# Patient Record
Sex: Male | Born: 1980 | Race: White | Hispanic: No | Marital: Single | Smoking: Current every day smoker
Health system: Southern US, Community
[De-identification: ages and names within clinical notes are randomized; demographics above are authoritative.]

## PROBLEM LIST (undated history)

## (undated) DIAGNOSIS — F1910 Other psychoactive substance abuse, uncomplicated: Secondary | ICD-10-CM

---

## 2001-06-29 ENCOUNTER — Emergency Department (HOSPITAL_COMMUNITY): Admission: EM | Admit: 2001-06-29 | Discharge: 2001-06-29 | Payer: Self-pay | Admitting: Emergency Medicine

## 2003-12-05 ENCOUNTER — Emergency Department (HOSPITAL_COMMUNITY): Admission: EM | Admit: 2003-12-05 | Discharge: 2003-12-06 | Payer: Self-pay | Admitting: Emergency Medicine

## 2004-05-11 ENCOUNTER — Emergency Department (HOSPITAL_COMMUNITY): Admission: EM | Admit: 2004-05-11 | Discharge: 2004-05-11 | Payer: Self-pay | Admitting: *Deleted

## 2007-05-21 ENCOUNTER — Encounter: Payer: Self-pay | Admitting: Emergency Medicine

## 2007-05-21 ENCOUNTER — Ambulatory Visit: Payer: Self-pay | Admitting: Psychiatry

## 2007-05-21 ENCOUNTER — Inpatient Hospital Stay (HOSPITAL_COMMUNITY): Admission: RE | Admit: 2007-05-21 | Discharge: 2007-06-01 | Payer: Self-pay | Admitting: Psychiatry

## 2008-03-01 ENCOUNTER — Emergency Department: Payer: Self-pay | Admitting: Emergency Medicine

## 2008-05-12 ENCOUNTER — Encounter: Admission: RE | Admit: 2008-05-12 | Discharge: 2008-05-12 | Payer: Self-pay | Admitting: Orthopedic Surgery

## 2008-07-17 IMAGING — CR DG CHEST 2V
2 series · 2 of 2 positions shown · non-contrast
Comparison: None.

CLINICAL DATA: Voluntary commitment for detoxification. Smoker with cough.

CHEST - 2 VIEW  05/21/2007:

[view not recorded (1 of 2)]
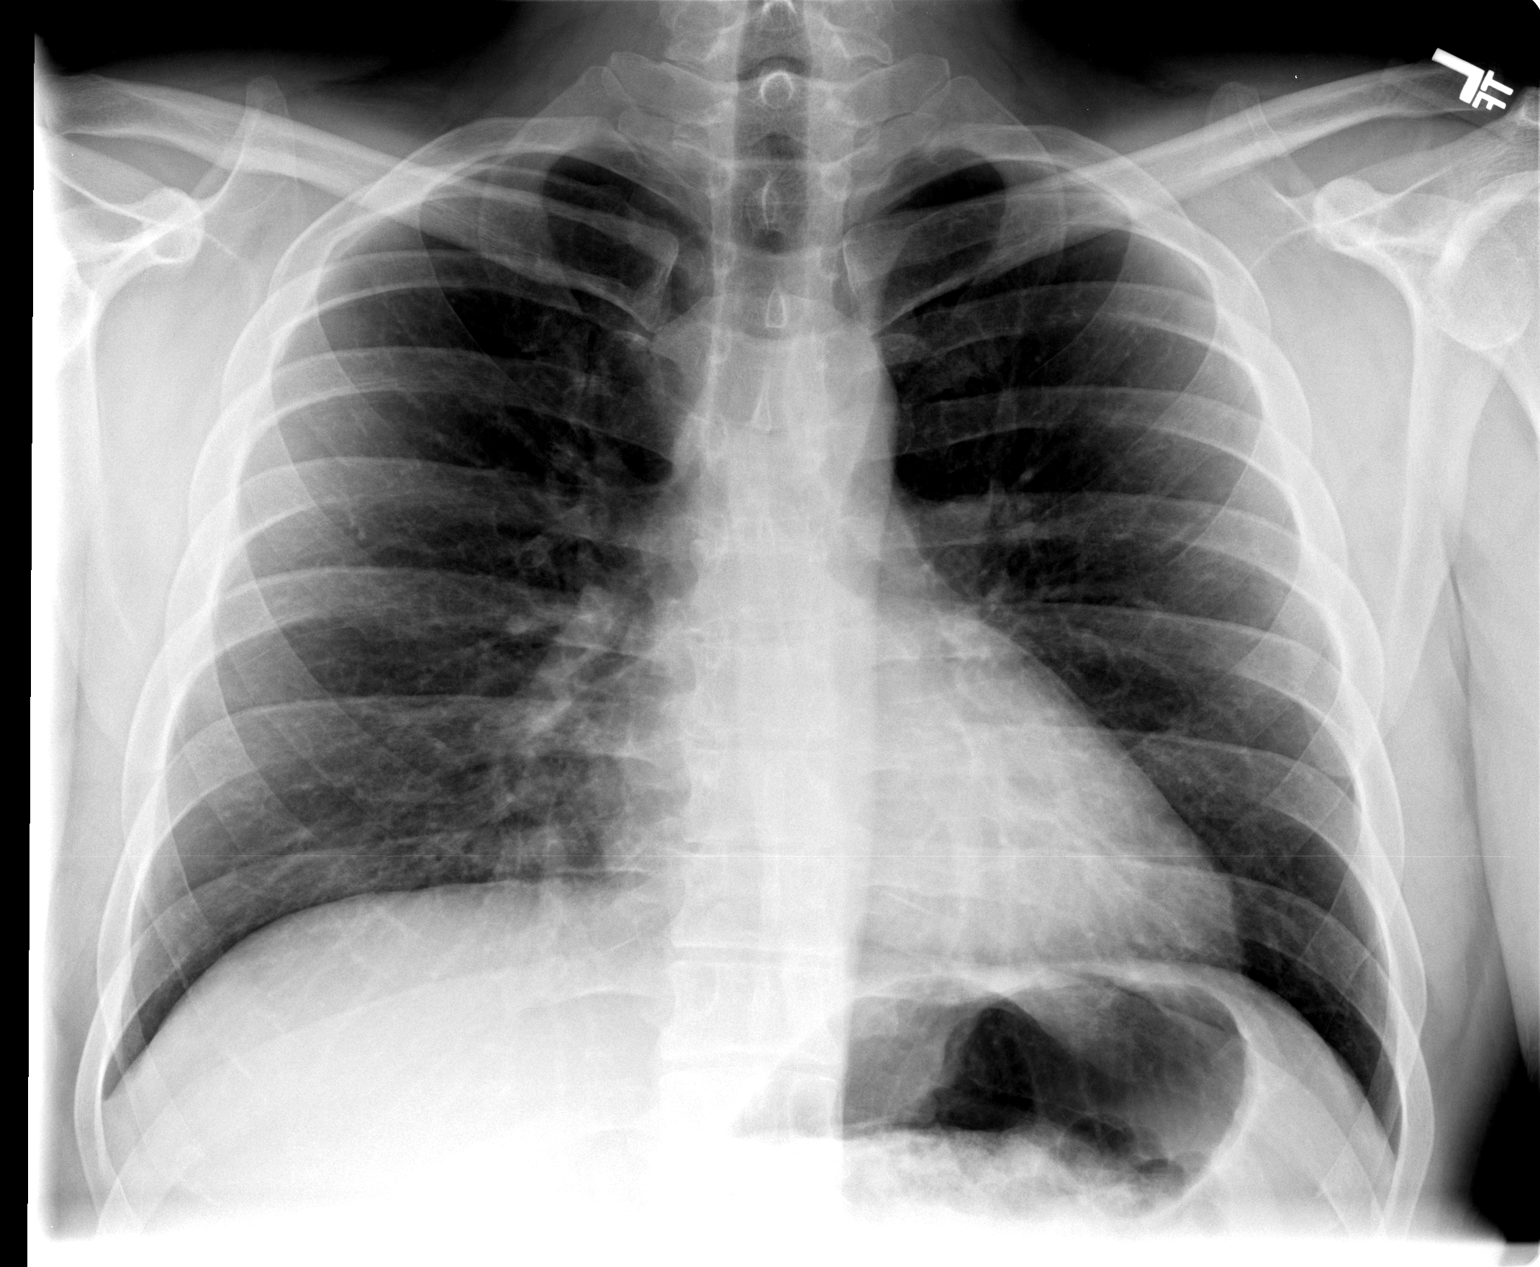

[view not recorded (2 of 2)]
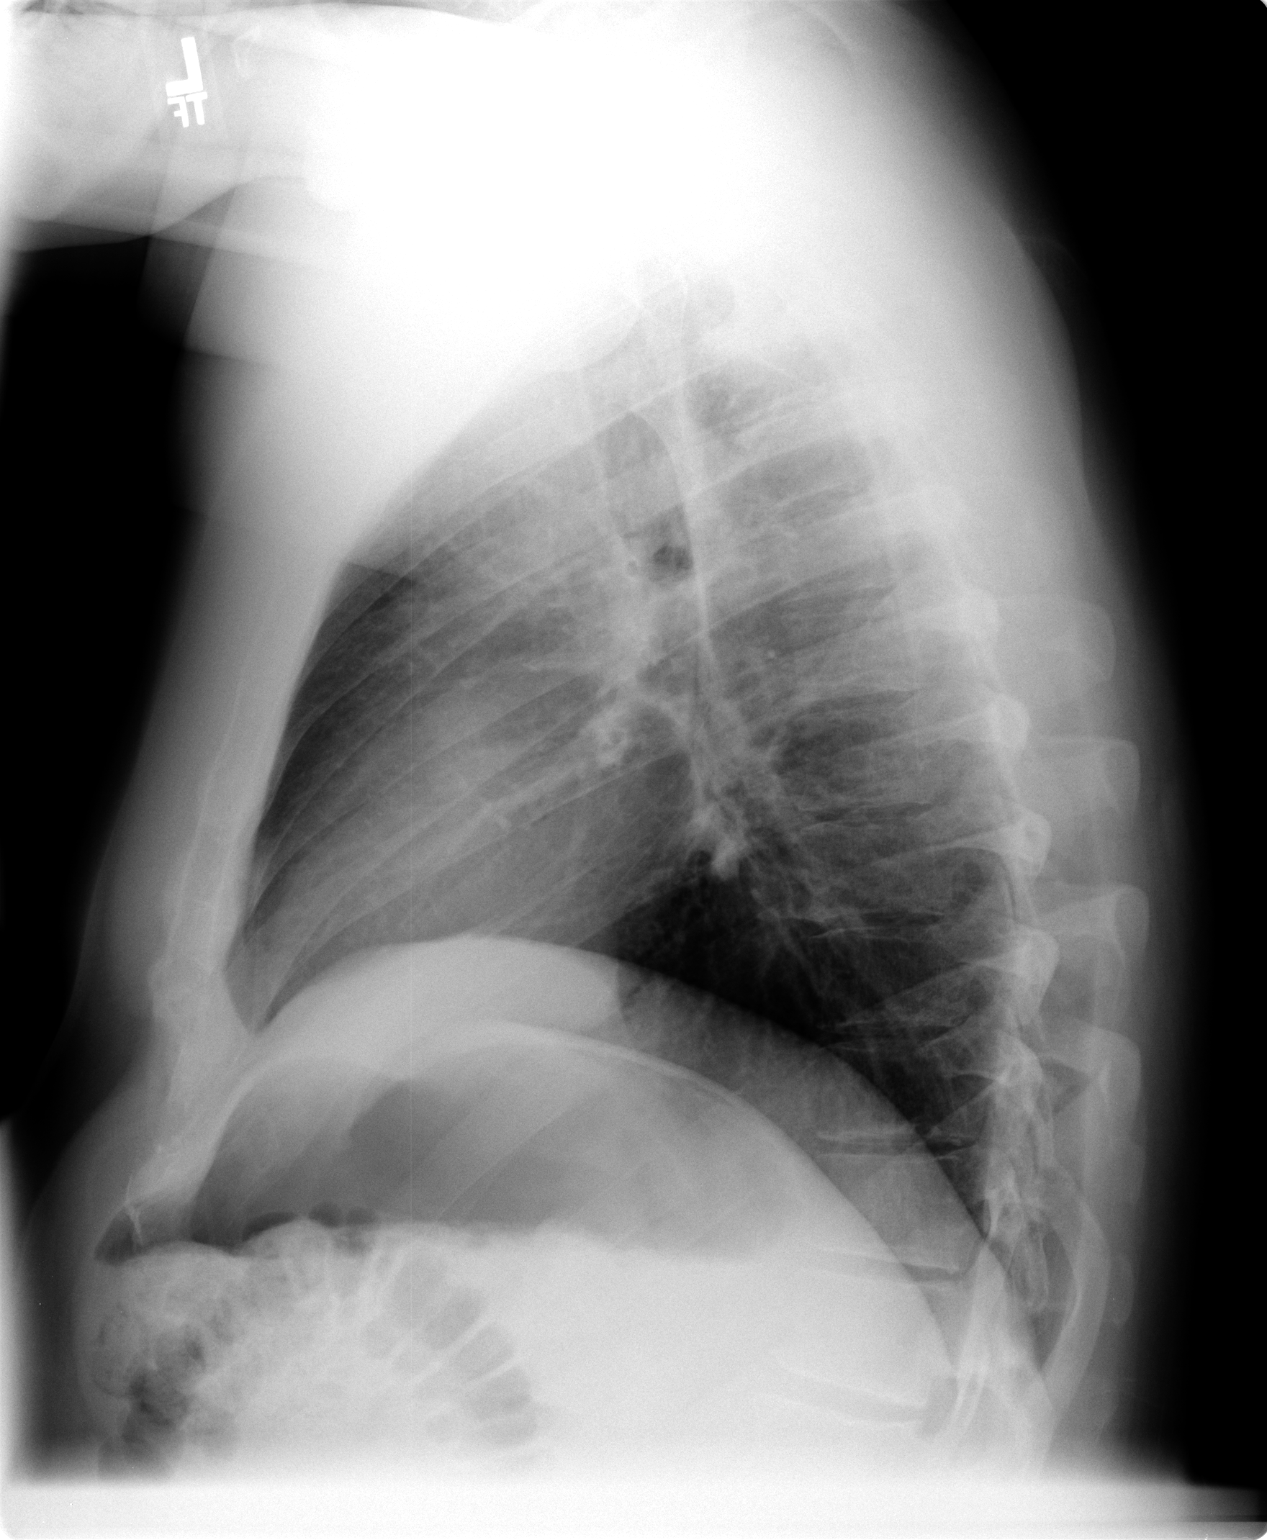

[2 of 2 positions shown; findings below may reference images not displayed]

FINDINGS: Cardiomediastinal silhouette unremarkable. Mildly prominent
bronchovascular markings centrally and mild central peribronchial thickening. No
localized airspace consolidation. No pleural effusions. Visualized bony thorax
intact.
IMPRESSION: Mild changes of asthma and/or bronchitis, either acute or chronic, without
localized consolidation.

## 2008-10-18 ENCOUNTER — Emergency Department (HOSPITAL_COMMUNITY): Admission: EM | Admit: 2008-10-18 | Discharge: 2008-10-18 | Payer: Self-pay | Admitting: Emergency Medicine

## 2009-07-09 IMAGING — CT CT EXTREM LOW W/O CM*R*
2 of 4 series · 7 of 14 positions shown, 8 images · non-contrast
Comparison: None

CLINICAL DATA: Fibular fracture July 2007 post ORIF.  New
injury 1 week ago.  Evaluate for nonunion.

CT OF THE RIGHT ANKLE WITHOUT CONTRAST
TECHNIQUE: Multidetector CT imaging of the right ankle was
performed according to the standard protocol without intravenous
contrast. Multiplanar CT image reconstructions were also generated.

[Series 2: knee/bone · axial · 0.31mm/px · z∈[-129,+4]mm · 4 of 178 slices shown, 5 images]
[im 36/178  soft-tissue]
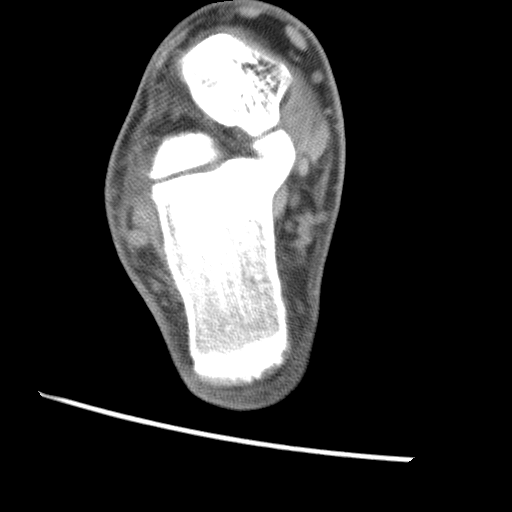
[im 36/178  bone]
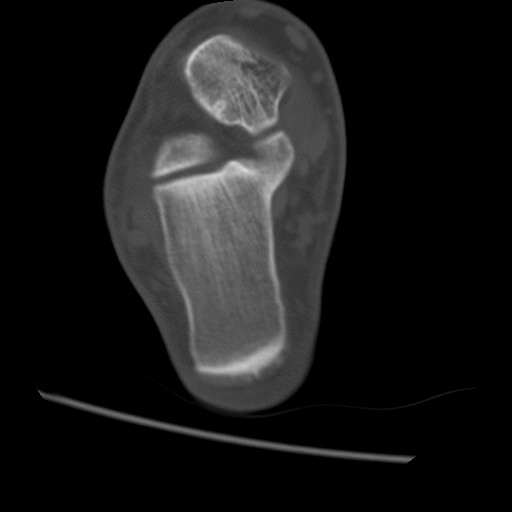
[im 71/178  bone]
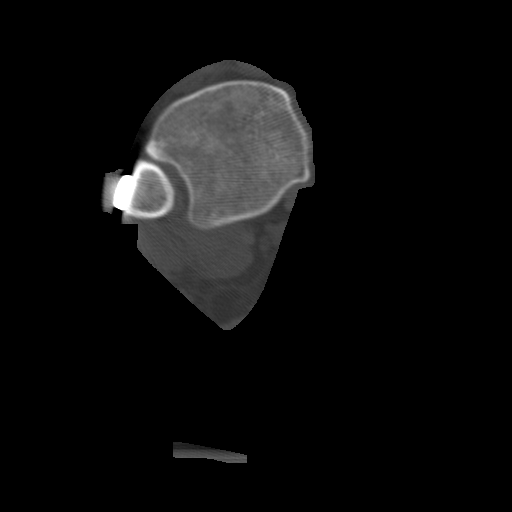
[im 107/178  bone]
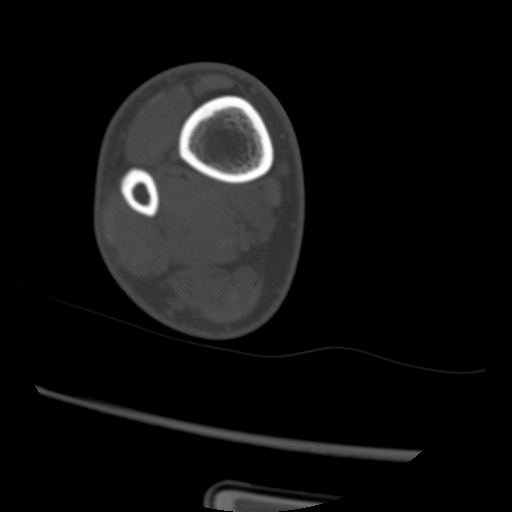
[im 142/178  bone]
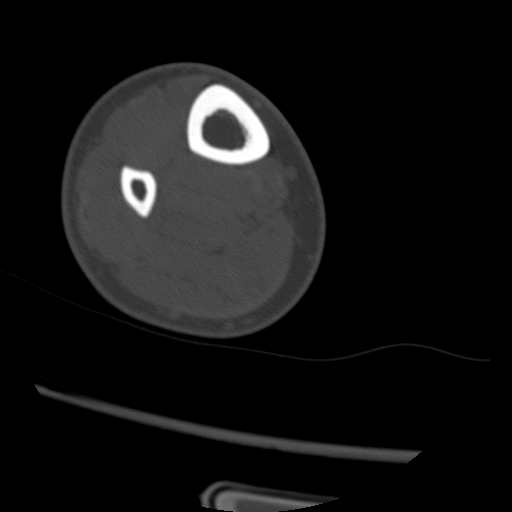

[Series 3: knee/detail · axial · 0.31mm/px · z∈[-118,-6]mm · 3 of 177 slices shown]
[im 45/177  bone]
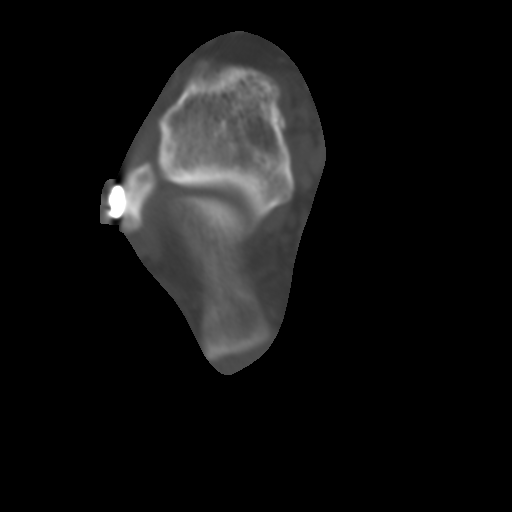
[im 89/177  bone]
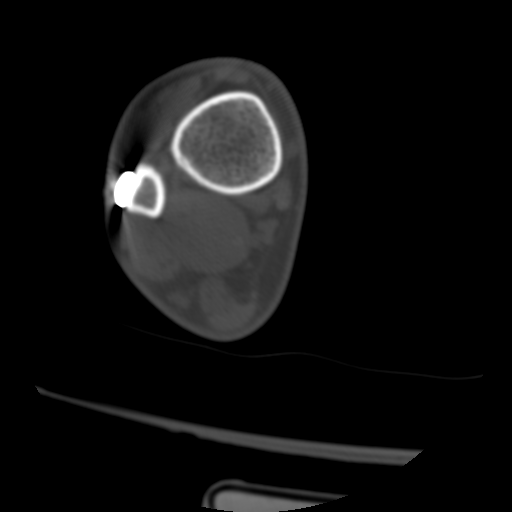
[im 133/177  bone]
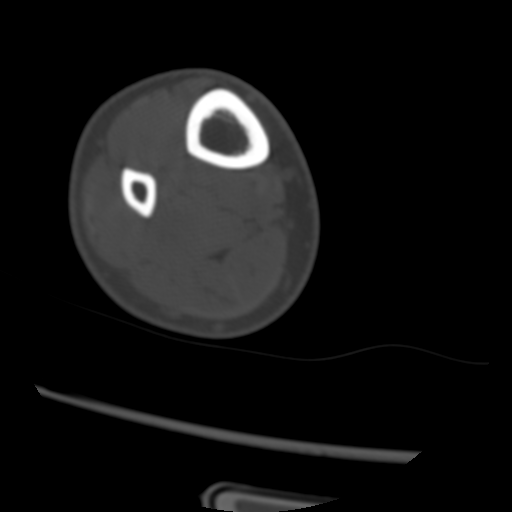

[7 of 14 positions shown; findings below may reference images not displayed]

FINDINGS: The patient is status post lateral screw and plate
fixation of the distal fibula.  The hardware is intact.  There is
no evidence of hardware loosening.  The fibular fracture appears
well healed.  There is no evidence of nonunion.  No acute fracture
or dislocation is seen.

There are small cysts anteriorly in the distal fibula adjacent to
the articulation with the talus.  There is no widening of the
distal tibiofibular joint.  However, there are small ossific
fragments both anteriorly and posteriorly in the joint, some of
which are likely intra-articular loose bodies.  The tibial plafond
and talar dome appear normal.  The medial malleolus appears normal.

The visualized tarsal bones appear normal.  No significant ankle
joint effusion is demonstrated.  There is no obvious tendon
abnormality at the ankle.
IMPRESSION: 1.  No evidence of distal fibular nonunion.  The hardware appears
intact without loosening.
2.  Post-traumatic subchondral cystic changes anteriorly in the
distal fibula adjacent to the talus.
3.  Periarticular ossifications around the distal tibiofibular
joint with probable intra-articular loose bodies.
4.  No evidence of acute fracture or dislocation.

## 2011-02-04 ENCOUNTER — Emergency Department (HOSPITAL_COMMUNITY): Payer: BC Managed Care – PPO

## 2011-02-04 ENCOUNTER — Emergency Department (HOSPITAL_COMMUNITY)
Admission: EM | Admit: 2011-02-04 | Discharge: 2011-02-04 | Disposition: A | Discharge status: Home / Self Care | Payer: BC Managed Care – PPO | Attending: Emergency Medicine | Admitting: Emergency Medicine

## 2011-02-04 DIAGNOSIS — R079 Chest pain, unspecified: Secondary | ICD-10-CM | POA: Insufficient documentation

## 2011-04-02 NOTE — H&P (Signed)
Ralph Shelton, AMAN NO.:  [id number]   MEDICAL RECORD NO.:  [id number]          PATIENT TYPE:  IPS   LOCATION:  0603                          FACILITY:  [facility]   PHYSICIAN:  Geoffery Lyons, M.D.      DATE OF BIRTH:  July 26, 1981   DATE OF ADMISSION:  05/21/2007  DATE OF DISCHARGE:                              HISTORY & PHYSICAL   This is a 30 year old male that was voluntarily admitted on 05/21/2007.   HISTORY OF PRESENT ILLNESS:  The patient presents with a history of  opiate abuse, has been using Percocet, OxyContin.  He had run out of his  methadone.  He has been on methadone for the past two years and wants to  be off his medications. He states it is going to kill me.  He also has  been using Xanax at night to help him sleep.  He denies any depression  or suicidal thoughts.  He has a history of substance abuse for the past  10 years.  His longest history of sobriety has been six months.  His  last use was on Tuesday prior to this admission.  The patient states  that he would ideally like to be slowly tapered off his methadone and  would like to go to a long-term treatment program.   PAST PSYCHIATRIC HISTORY:  The patient has been detoxed prior at [facility].  He is currently on methadone 80 mg daily.  In the past he has  been on Wellbutrin and Zoloft.   SOCIAL HISTORY:  This is a 30 year old male who is currently on  disability for his bipolar disorder.  He has two children.   FAMILY HISTORY:  His brother is currently at [facility] for an  overdose of methadone.   ALCOHOL/DRUG HISTORY:  As described above.  Denies any IV drug use.   PRIMARY CARE PHYSICIAN:  None.   MEDICAL PROBLEMS:  Currently has a history of bronchitis and is on  Zithromax.   MEDICATIONS:  Methadone 20 mg q.i.d.   ALLERGIES:  PENICILLIN AND PROZAC.   PHYSICAL EXAMINATION:  The patient was fully assessed at [facility].  Temperature is 100.4, pulse 127, respirations 18,  blood  pressure 138/88, 6' tall, 218 pounds.  He is a healthy appearing male.  Has a productive cough, but in no acute respiratory distress.   MENTAL STATUS EXAMINATION:  He is fully alert, cooperative.  Good eye  contact.  Nicely dressed.  Speech is clear, normal tone.  The patient's  mood, is having withdrawal symptoms, he is hurting.  Affect is  constricted.  Thought processes are coherent.  There is no evidence of  any thought disorder.  Cognitive function intact.  His judgment is good,  insight is fair.  Poor impulse control.  Concentration intact.  Good  fund of worldly knowledge.   LABORATORY DATA:  Urine drug screen is positive for opiates, positive  for benzo's.  Alcohol level less than 5.   WBC count is 14.6, BMET is within normal limits.   IMPRESSION:  AXIS I:  Opiate dependence.  Benzodiazepine abuse.  Bipolar  disorder, not otherwise specified.  AXIS II:  Deferred.  AXIS III:  Fibromyalgia.  Bronchitis.  AXIS IV:  Psychosocial problems related to chronic substance abuse.  AXIS V:  Current is 45.   PLAN:  Contract for safety.  Stabilize __________.  We will initially  put the patient on the Clonidine protocol, work on relapse prevention.  Will have Seroquel p.r.n.  The patient has been on that medication in  the past and has received some benefit.  Case Manager is to investigate  any rehab program available.  We will continue Z-pak for his bronchitis  symptoms.  Encourage fluids.   ESTIMATED LENGTH OF STAY:  Three to four days.      Landry Corporal, N.P.      Geoffery Lyons, M.D.  Electronically Signed    JO/MEDQ  D:  05/25/2007  T:  05/25/2007  Job:  [id number]

## 2011-04-05 NOTE — Discharge Summary (Signed)
NAMETLALOC, TADDEI NO.:  [id number]   MEDICAL RECORD NO.:  [id number]          PATIENT TYPE:  IPS   LOCATION:  0603                          FACILITY:  [facility]   PHYSICIAN:  Anselm Jungling, MD  DATE OF BIRTH:  11/22/1980   DATE OF ADMISSION:  05/21/2007  DATE OF DISCHARGE:  06/01/2007                               DISCHARGE SUMMARY   IDENTIFYING DATA/REASON FOR ADMISSION:  The patient is a 30 year old  male that presented with a history of opiate abuse.  He had been abusing  Percocet and OxyContin.  Also, he reported that he had run out of his  methadone, which he had been on for the previous two years.  He also  been using Xanax at night to help him sleep.  He denied depression or  suicidal thoughts.  He admitted to a 10-year history of substance abuse,  his longest period of sobriety having been six months.  His last use was  several days prior to admission.  He indicated that his wish was to be  slowly tapered off methadone, and to go to a long-term chemical  dependency treatment program.  Please refer to the admission note for  further details pertaining to the symptoms, circumstances and history  that led to his hospitalization.   INITIAL DIAGNOSTIC IMPRESSION:  He was given initial AXIS I diagnoses of  opiate dependence, benzodiazepine abuse, and rule out bipolar disorder  not otherwise specified.   MEDICAL/LABORATORY:  The patient reported a history of fibromyalgia and  bronchitis.  There were no significant medical issues during his stay.   HOSPITAL COURSE:  The patient was admitted to the adult inpatient  psychiatric service.  He presented as a well-nourished, well-developed  male who was clearly not psychotic, and did not display any signs or  symptoms of depressive disorder.  He was initially evaluated by Dr.  Dub Mikes.  The undersigned assumed care of the patient on the fifth hospital  day.  At that time, the patient indicated that he was hoping to go  to  [facility] in [place].  He was reporting  minimal cravings and withdrawal symptoms, but strong nicotine cravings.  He had gone through a clonidine detox protocol with respect to his  opiate addiction.   He presented as open, pleasant, and strongly motivated for long-term  recovery.   He was continued on methadone during his stay, as it was not felt  feasible to completely detoxify him from methadone in the time frame  that we could provide.  His methadone was tapered downward by  approximately 10% every 2-3 days.  He experienced significant  restlessness, anxiousness, dysphoria and racing thoughts associated with  a reduction in his methadone.  Risperdal 0.5 mg t.i.d. was utilized on a  trial basis to help address his anxiety and agitation.   After the sixth hospital day, we learned that there were no long-term  treatment program that would accept him while he was still on methadone.  We determined then that it was necessary to completely detoxify him from  methadone.  He was placed back on a clonidine protocol.  Although he was  very apprehensive about how he would handle this process, he did  relatively well with it.   On the 12th hospital day, he appeared to be appropriate for transfer to  the [facility] in [place].   AFTERCARE:   DISCHARGE MEDICATIONS:  Seroquel 50 mg q.h.s..  Seroquel had proved  superior to Risperdal for him in managing insomnia and symptoms of  agitation.   DISCHARGE DIAGNOSES:  AXIS I:  Opiate dependence, early remission.  Benzodiazepine abuse.  AXIS II:  Deferred.  AXIS III:  No acute or chronic illnesses.  AXIS IV:  Stressors:  Severe.  AXIS V:  GAF on discharge 50.      Anselm Jungling, MD  Electronically Signed     SPB/MEDQ  D:  06/19/2007  T:  06/19/2007  Job:  [id number]

## 2011-09-03 LAB — HEPATIC FUNCTION PANEL
ALT: 42
AST: 38 — ABNORMAL HIGH
Albumin: 3.7
Alkaline Phosphatase: 86
Bilirubin, Direct: 0.1
Indirect Bilirubin: 0.5
Total Bilirubin: 0.6
Total Protein: 7.1

## 2011-09-03 LAB — BASIC METABOLIC PANEL
BUN: 4 — ABNORMAL LOW
Calcium: 9.4
Chloride: 106
Creatinine, Ser: 0.92
GFR calc Af Amer: >60

## 2011-09-03 LAB — DIFFERENTIAL
Basophils Absolute: 0.1
Basophils Relative: 1
Eosinophils Absolute: 0
Eosinophils Relative: 0
Lymphocytes Relative: 23
Lymphs Abs: 3.3
Monocytes Absolute: 0.7
Monocytes Relative: 5
Neutro Abs: 10.5 — ABNORMAL HIGH
Neutrophils Relative %: 72

## 2011-09-03 LAB — URINALYSIS, ROUTINE W REFLEX MICROSCOPIC
Glucose, UA: NEGATIVE
Hgb urine dipstick: NEGATIVE
Ketones, ur: NEGATIVE
Nitrite: NEGATIVE
Protein, ur: NEGATIVE
Specific Gravity, Urine: 1.02
Urobilinogen, UA: 0.2
pH: 7.5

## 2011-09-03 LAB — RAPID URINE DRUG SCREEN, HOSP PERFORMED
Amphetamines: NOT DETECTED
Barbiturates: NOT DETECTED
Benzodiazepines: POSITIVE — AB
Cocaine: NOT DETECTED
Opiates: POSITIVE — AB
Tetrahydrocannabinol: NOT DETECTED

## 2011-09-03 LAB — CBC
HCT: 45.5
HCT: 40.9
Hemoglobin: 15.3
Hemoglobin: 13.9
MCHC: 33.7
MCHC: 33.9
MCV: 85.1
MCV: 85.2
Platelets: 468 — ABNORMAL HIGH
RBC: 5.35
RDW: 13.2
RDW: 13.7
WBC: 14.6 — ABNORMAL HIGH

## 2011-09-03 LAB — ETHANOL: Alcohol, Ethyl (B): <5

## 2015-02-08 ENCOUNTER — Emergency Department (HOSPITAL_COMMUNITY)
Admission: EM | Admit: 2015-02-08 | Discharge: 2015-02-08 | Disposition: A | Discharge status: Home / Self Care | Payer: Self-pay | Source: Home / Self Care

## 2015-06-28 ENCOUNTER — Emergency Department (HOSPITAL_COMMUNITY)
Admission: EM | Admit: 2015-06-28 | Discharge: 2015-06-28 | Disposition: A | Discharge status: Home / Self Care | Payer: BLUE CROSS/BLUE SHIELD | Attending: Emergency Medicine | Admitting: Emergency Medicine

## 2015-06-28 ENCOUNTER — Encounter (HOSPITAL_COMMUNITY): Payer: Self-pay | Admitting: Emergency Medicine

## 2015-06-28 DIAGNOSIS — W57XXXA Bitten or stung by nonvenomous insect and other nonvenomous arthropods, initial encounter: Secondary | ICD-10-CM | POA: Diagnosis not present

## 2015-06-28 DIAGNOSIS — Y939 Activity, unspecified: Secondary | ICD-10-CM | POA: Diagnosis not present

## 2015-06-28 DIAGNOSIS — Y999 Unspecified external cause status: Secondary | ICD-10-CM | POA: Insufficient documentation

## 2015-06-28 DIAGNOSIS — L02414 Cutaneous abscess of left upper limb: Secondary | ICD-10-CM | POA: Diagnosis not present

## 2015-06-28 DIAGNOSIS — S40862A Insect bite (nonvenomous) of left upper arm, initial encounter: Secondary | ICD-10-CM | POA: Diagnosis present

## 2015-06-28 DIAGNOSIS — Y929 Unspecified place or not applicable: Secondary | ICD-10-CM | POA: Diagnosis not present

## 2015-06-28 DIAGNOSIS — L02419 Cutaneous abscess of limb, unspecified: Secondary | ICD-10-CM

## 2015-06-28 HISTORY — DX: Other psychoactive substance abuse, uncomplicated: F19.10

## 2015-06-28 MED ORDER — SULFAMETHOXAZOLE-TRIMETHOPRIM 800-160 MG PO TABS: 1.0000 | ORAL_TABLET | Freq: Two times a day (BID) | ORAL | Status: AC

## 2015-06-28 MED ORDER — LIDOCAINE-EPINEPHRINE (PF) 2 %-1:200000 IJ SOLN
10.0000 mL | Freq: Once | INTRAMUSCULAR | Status: AC
  Administered 2015-06-28: 10 mL via INTRADERMAL
  Filled 2015-06-28: qty 20

## 2015-06-28 MED ORDER — TRAMADOL HCL 50 MG PO TABS: 50.0000 mg | ORAL_TABLET | Freq: Four times a day (QID) | ORAL | Status: DC | PRN

## 2015-06-28 MED ORDER — CEPHALEXIN 500 MG PO CAPS: 500.0000 mg | ORAL_CAPSULE | Freq: Three times a day (TID) | ORAL | Status: DC

## 2015-06-28 NOTE — ED Notes (Signed)
Pt. Stated, I got bit by a spider a few days ago on my left arm, it s making me feel sick.

## 2015-06-28 NOTE — ED Provider Notes (Signed)
CSN: [id number]     Arrival date & time 06/28/15  1143 History  This chart was scribed for non-physician practitioner, Lottie Mussel, PA-C, working with Laurence Spates, MD by Charline Bills, ED Scribe. This patient was seen in room TR07C/TR07C and the patient's care was started at 12:50 PM.   Chief Complaint  Patient presents with  . Insect Bite   The history is provided by the patient. No language interpreter was used.   HPI Comments: Ralph Shelton is a 34 y.o. male who presents to the Emergency Department complaining of gradually worsening painful abscess to left upper arm for the past week. Pt states that the area started as a small area similar to a mosquito bite 1 week ago. He reports redness surrounding the affected area and drainage since yesterday. Pt denies fever.   Pt also presents with a pruritic, erythematous rash to his left ring finger for the past month. 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 No treatments tried PTA/   Past Medical History  Diagnosis Date  . Drug abuse    History reviewed. No pertinent past surgical history. No family history on file. Social History  Substance Use Topics  . Smoking status: Never Smoker   . Smokeless tobacco: None  . Alcohol Use: No    Review of Systems  Constitutional: Negative for fever.  Skin: Positive for color change and rash.       + Abscess   Allergies  Review of patient's allergies indicates not on file.  Home Medications   Prior to Admission medications   Not on File   BP 158/90 mmHg  Pulse 68  Temp(Src) 98.3 F (36.8 C)  Resp 19  Ht 6' (1.829 m)  Wt 260 lb (117.935 kg)  BMI 35.25 kg/m2  SpO2 99% Physical Exam  Constitutional: He is oriented to person, place, and time. He appears well-developed and well-nourished. No distress.  HENT:  Head: Normocephalic and atraumatic.  Eyes: Conjunctivae and EOM are normal.  Neck: Neck  supple. No tracheal deviation present.  Cardiovascular: Normal rate.   Pulmonary/Chest: Effort normal. No respiratory distress.  Musculoskeletal: Normal range of motion.  Neurological: He is alert and oriented to person, place, and time.  Skin: Skin is warm and dry.  2 x 3 cm abscess to the left antecubital fossa with some purulent drainage present. Tender to palpation. Mild erythema surrounding the abscess. Distal radial pulses intact  Psychiatric: He has a normal mood and affect. His behavior is normal.  Nursing note and vitals reviewed.  ED Course  Procedures (including critical care time) DIAGNOSTIC STUDIES: Oxygen Saturation is 99% on RA, normal by my interpretation.    COORDINATION OF CARE: 12:53 PM-Discussed treatment plan which includes I&D, Ultram, Keflex and Bactrim DS with pt at bedside and pt agreed to plan.   Labs Review Labs Reviewed - No data to display  Imaging Review No results found.   EKG Interpretation None     INCISION AND DRAINAGE Performed by: Jaynie Crumble A Consent: Verbal consent obtained. Risks and benefits: risks, benefits and alternatives were discussed Type: abscess  Body area: left antecubital fossa  Anesthesia: local infiltration  Incision was made with a scalpel.  Local anesthetic: lidocaine 2% w epinephrine  Anesthetic total: 3 ml  Complexity: complex Blunt dissection to break up loculations  Drainage: purulent  Drainage amount: moderate  Packing material: 1/4 in  iodoform gauze  Patient tolerance: Patient tolerated the procedure well with no immediate complications.    MDM   Final diagnoses:  Abscess of antecubital fossa   patient with an abscess to the left antecubital fossa. It's already draining, but remains indurated. I incised and drained abscess. Irrigated. Packed. We'll start on Bactrim and Keflex. Wound care at home including soaks and warm compresses. Follow-up as needed. Patient is afebrile, otherwise  nontoxic appearing  Filed Vitals:   06/28/15 1152 06/28/15 1318  BP: 158/90 148/88  Pulse: 68 83  Temp: 98.3 F (36.8 C) 98.1 F (36.7 C)  TempSrc:  Oral  Resp: 19 12  Height: 6' (1.829 m)   Weight: 260 lb (117.935 kg)   SpO2: 99% 100%     Jaynie Crumble, PA-C 06/28/15 1635  Laurence Spates, MD 06/29/15 [phone]

## 2015-06-28 NOTE — Discharge Instructions (Signed)
Take both antibiotics as prescribed until all gone. Tramadol for pain as needed. Follow-up in 2 days for recheck and packing removal or if it looks well and remove packing herself and continue taking antibiotics. Warm soaks or compresses at home.   Abscess Care After An abscess (also called a boil or furuncle) is an infected area that contains a collection of pus. Signs and symptoms of an abscess include pain, tenderness, redness, or hardness, or you may feel a moveable soft area under your skin. An abscess can occur anywhere in the body. The infection may spread to surrounding tissues causing cellulitis. A cut (incision) by the surgeon was made over your abscess and the pus was drained out. Gauze may have been packed into the space to provide a drain that will allow the cavity to heal from the inside outwards. The boil may be painful for 5 to 7 days. Most people with a boil do not have high fevers. Your abscess, if seen early, may not have localized, and may not have been lanced. If not, another appointment may be required for this if it does not get better on its own or with medications. HOME CARE INSTRUCTIONS   Only take over-the-counter or prescription medicines for pain, discomfort, or fever as directed by your caregiver.  When you bathe, soak and then remove gauze or iodoform packs at least daily or as directed by your caregiver. You may then wash the wound gently with mild soapy water. Repack with gauze or do as your caregiver directs. SEEK IMMEDIATE MEDICAL CARE IF:   You develop increased pain, swelling, redness, drainage, or bleeding in the wound site.  You develop signs of generalized infection including muscle aches, chills, fever, or a general ill feeling.  An oral temperature above 102 F (38.9 C) develops, not controlled by medication. See your caregiver for a recheck if you develop any of the symptoms described above. If medications (antibiotics) were prescribed, take them as  directed. Document Released: 05/23/2005 Document Revised: 01/27/2012 Document Reviewed: 01/18/2008 [facility] Patient Information 2015 [place], [place]. This information is not intended to replace advice given to you by your health care provider. Make sure you discuss any questions you have with your health care provider.

## 2015-06-28 NOTE — ED Notes (Signed)
Takes  [place] everyday

## 2017-02-25 ENCOUNTER — Ambulatory Visit (INDEPENDENT_AMBULATORY_CARE_PROVIDER_SITE_OTHER): Payer: BLUE CROSS/BLUE SHIELD | Admitting: Student

## 2017-02-25 VITALS — BP 156/85 | HR 75 | Temp 98.1°F | Resp 18 | Ht 72.0 in | Wt 278.0 lb

## 2017-02-25 DIAGNOSIS — Z789 Other specified health status: Secondary | ICD-10-CM

## 2017-02-25 NOTE — Progress Notes (Signed)
Pt left without being seen  Signed,  Corliss Marcus, DO [facility] 6:06 PM

## 2017-02-25 NOTE — Progress Notes (Deleted)
   Subjective:    Patient ID: Ralph Shelton, male    DOB: Apr 15, 1981, 36 y.o.   MRN: [id number]  HPI  PMHx - Updated and reviewed.  Contributory factors include: Negative PSHx - Updated and reviewed.  Contributory factors include:  Negative FHx - Updated and reviewed.  Contributory factors include:  Negative Social Hx - Updated and reviewed. Contributory factors include: Negative Medications - reviewed    Review of Systems     Objective:   Physical Exam BP (!) 156/85   Pulse 75   Temp 98.1 F (36.7 C) (Oral)   Resp 18   Ht 6' (1.829 m)   Wt 278 lb (126.1 kg)   SpO2 92%   BMI 37.70 kg/m         Assessment & Plan:  No problem-specific Assessment & Plan notes found for this encounter.

## 2017-02-25 NOTE — Patient Instructions (Signed)
     IF you received an x-ray today, you will receive an invoice from [facility]. Please contact [facility] at [phone] with questions or concerns regarding your invoice.   IF you received labwork today, you will receive an invoice from [facility]. Please contact [facility] at [phone] with questions or concerns regarding your invoice.   Our billing staff will not be able to assist you with questions regarding bills from these companies.  You will be contacted with the lab results as soon as they are available. The fastest way to get your results is to activate your My Chart account. Instructions are located on the last page of this paperwork. If you have not heard from us regarding the results in 2 weeks, please contact this office.
